# Patient Record
Sex: Male | Born: 1975 | Hispanic: No | Marital: Single | Smoking: Never smoker
Health system: Southern US, Community
[De-identification: ages and names within clinical notes are randomized; demographics above are authoritative.]

## PROBLEM LIST (undated history)

## (undated) HISTORY — PX: APPENDECTOMY: SHX54

---

## 1998-06-23 ENCOUNTER — Ambulatory Visit (HOSPITAL_BASED_OUTPATIENT_CLINIC_OR_DEPARTMENT_OTHER): Admission: RE | Admit: 1998-06-23 | Discharge: 1998-06-23 | Payer: Self-pay | Admitting: Orthopedic Surgery

## 2012-05-20 ENCOUNTER — Emergency Department: Payer: Self-pay | Admitting: Emergency Medicine

## 2017-01-26 ENCOUNTER — Encounter: Payer: Self-pay | Admitting: Emergency Medicine

## 2017-01-26 ENCOUNTER — Ambulatory Visit
Admission: EM | Admit: 2017-01-26 | Discharge: 2017-01-26 | Disposition: A | Discharge status: Home / Self Care | Payer: Worker's Compensation | Attending: Family Medicine | Admitting: Family Medicine

## 2017-01-26 ENCOUNTER — Ambulatory Visit (INDEPENDENT_AMBULATORY_CARE_PROVIDER_SITE_OTHER): Payer: Worker's Compensation

## 2017-01-26 DIAGNOSIS — M25562 Pain in left knee: Secondary | ICD-10-CM

## 2017-01-26 DIAGNOSIS — M25462 Effusion, left knee: Secondary | ICD-10-CM | POA: Diagnosis not present

## 2017-01-26 DIAGNOSIS — W1842XA Slipping, tripping and stumbling without falling due to stepping into hole or opening, initial encounter: Secondary | ICD-10-CM | POA: Diagnosis not present

## 2017-01-26 MED ORDER — NAPROXEN 500 MG PO TABS: 500.0000 mg | ORAL_TABLET | Freq: Two times a day (BID) | ORAL | 0 refills | Status: DC

## 2017-01-26 MED ORDER — KETOROLAC TROMETHAMINE 60 MG/2ML IM SOLN
60.0000 mg | Freq: Once | INTRAMUSCULAR | Status: AC
  Administered 2017-01-26: 60 mg via INTRAMUSCULAR

## 2017-01-26 NOTE — ED Provider Notes (Signed)
[facility]    CSN: [id number] Arrival date & time: 01/26/17  1752     History   Chief Complaint Chief Complaint  Patient presents with  . Knee Pain    left    HPI Trevor Mann is a 41 y.o. male.   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 At first it didn't seem to bother him but then as the day progressed he started having swelling and aching over the medial and lateral joint lines and when walking felt like he was something was rubbing in his patella. He said one point he almost had a vasovagal episode did not have a syncopal episode. He is able to walk on his leg but the pain is very severe.         History reviewed. No pertinent past medical history.  There are no active problems to display for this patient.   Past Surgical History:  Procedure Laterality Date  . APPENDECTOMY         Home Medications    Prior to Admission medications   Medication Sig Start Date End Date Taking? Authorizing Provider  naproxen (NAPROSYN) 500 MG tablet Take 1 tablet (500 mg total) by mouth 2 (two) times daily with a meal. 01/26/17   Lutricia Feiloemer, Yeison Sippel P, PA-C    Family History Family History  Problem Relation Age of Onset  . Lung cancer Mother     Social History Social History  Substance Use Topics  . Smoking status: Never Smoker  . Smokeless tobacco: Never Used  . Alcohol use Yes     Allergies   Patient has no known allergies.   Review of Systems Review of Systems  Constitutional: Positive for activity change. Negative for appetite change, chills, fatigue and fever.  Musculoskeletal: Positive for arthralgias, gait problem and joint swelling.  All other systems reviewed and are negative.    Physical Exam Triage Vital Signs ED Triage Vitals  Enc Vitals Group     BP 01/26/17 1811 134/79     Pulse Rate  01/26/17 1811 94     Resp 01/26/17 1811 16     Temp 01/26/17 1811 99 F (37.2 C)     Temp Source 01/26/17 1811 Oral     SpO2 01/26/17 1811 100 %     Weight 01/26/17 1808 (!) 335 lb (152 kg)     Height 01/26/17 1808 6\' 7"  (2.007 m)     Head Circumference --      Peak Flow --      Pain Score 01/26/17 1809 8     Pain Loc --      Pain Edu? --      Excl. in GC? --    No data found.   Updated Vital Signs BP 134/79 (BP Location: Right Arm)   Pulse 94   Temp 99 F (37.2 C) (Oral)   Resp 16   Ht 6\' 7"  (2.007 m)   Wt (!) 335 lb (152 kg)   SpO2 100%   BMI 37.74 kg/m   Visual Acuity Right Eye Distance:   Left Eye Distance:   Bilateral Distance:    Right Eye Near:   Left Eye Near:    Bilateral Near:     Physical Exam  Constitutional: He is oriented to person, place, and time. He appears well-developed and  well-nourished. No distress.  HENT:  Head: Normocephalic.  Eyes: Pupils are equal, round, and reactive to light.  Neck: Normal range of motion.  Musculoskeletal: He exhibits edema and tenderness.  Examination of the left knee shows swelling prepatellar. Elevation has good control of the quad is able to fully extend his knee with that discomfort. There is no effusion is elicited. There is a tenderness over the patella superficially. He has also pain over the medial joint line. Patient is very painful and adequate examination is not capable due to his pain and inability to cooperate fully.  Neurological: He is alert and oriented to person, place, and time.  Skin: Skin is warm and dry. He is not diaphoretic.  Psychiatric: He has a normal mood and affect. His behavior is normal. Judgment and thought content normal.  Nursing note and vitals reviewed.    UC Treatments / Results  Labs (all labs ordered are listed, but only abnormal results are displayed) Labs Reviewed - No data to display  EKG  EKG Interpretation None       Radiology Dg Knee Complete 4 Views  Left  Result Date: 01/26/2017 CLINICAL DATA:  Patient states that he stepped in a hole and pushed his left knee out today while on his job. Patient c/o pain in his left knee. Pt unable to stand for x-rays. EXAM: LEFT KNEE - COMPLETE 4+ VIEW COMPARISON:  None. FINDINGS: No fracture of the proximal tibia or distal femur. Patella is normal. No joint effusion. Swelling of the prepatellar soft tissue inferior to the patella IMPRESSION: Inferior prepatellar soft tissue swelling. No fracture dislocation. Electronically Signed   By: Genevive Bi M.D.   On: 01/26/2017 18:59    Procedures Procedures (including critical care time)  Medications Ordered in UC Medications  ketorolac (TORADOL) injection 60 mg (60 mg Intramuscular Given 01/26/17 1828)     Initial Impression / Assessment and Plan / UC Course  I have reviewed the triage vital signs and the nursing notes.  Pertinent labs & imaging results that were available during my care of the patient were reviewed by me and considered in my medical decision making (see chart for details).     Plan: 1. Test/x-ray results and diagnosis reviewed with patient 2. rx as per orders; risks, benefits, potential side effects reviewed with patient 3. Recommend supportive treatment with elevation and ice to control swelling and pain. Use knee immobilizer for ambulation. May come out of the knee immobilizer off going quiet interactive times and for sleep. Use crutches with touchdown to partial weightbearing gait for comfort. Follow-up with [facility] next week if not improving. 4. F/u prn if symptoms worsen or don't improve   Final Clinical Impressions(s) / UC Diagnoses   Final diagnoses:  Prepatellar effusion of left knee  Traumatic, possibly hemorrhagic  New Prescriptions New Prescriptions   NAPROXEN (NAPROSYN) 500 MG TABLET    Take 1 tablet (500 mg total) by mouth 2 (two) times daily with a meal.     Controlled Substance Prescriptions [place]  Controlled Substance Registry consulted? Not Applicable   Lutricia Feil, PA-C 01/26/17 1929

## 2017-01-26 NOTE — ED Triage Notes (Signed)
Patient states that he stepped in a hole and pushed his left knee out today while on his job.  Patient c/o pain in his left knee.

## 2017-01-26 NOTE — Discharge Instructions (Signed)
Elevate to your knee above your heart most of the time. Apply ice 20 minutes out of every 2 hours 45 times daily. May come out of the knee brace for sleeping or 4. Her periods of inactivity. Use crutches with a touchdown to partial weightbearing gait. If not improving by early next week scheduled appointment with orthopedic surgery as supplied to you.

## 2017-01-28 ENCOUNTER — Encounter: Payer: Self-pay | Admitting: Gynecology

## 2017-01-28 ENCOUNTER — Ambulatory Visit
Admission: EM | Admit: 2017-01-28 | Discharge: 2017-01-28 | Disposition: A | Discharge status: Home / Self Care | Payer: Worker's Compensation | Attending: Family Medicine | Admitting: Family Medicine

## 2017-01-28 DIAGNOSIS — L03116 Cellulitis of left lower limb: Secondary | ICD-10-CM

## 2017-01-28 DIAGNOSIS — M00862 Arthritis due to other bacteria, left knee: Secondary | ICD-10-CM

## 2017-01-28 DIAGNOSIS — M009 Pyogenic arthritis, unspecified: Secondary | ICD-10-CM

## 2017-01-28 NOTE — ED Provider Notes (Signed)
[facility]    CSN: [id number] Arrival date & time: 01/28/17  7564     History   Chief Complaint No chief complaint on file.   HPI Trevor Mann is a 41 y.o. male.   Patient is a 41 year old male who presents as a follow-up to a visit on the 25th. Patient was at work when he stepped in a hole, causing his mucobuccal. Patient did not have any pain initially but then afterwards the pain began to worsen. He was seen here with x-rays done with some peripatellar inflammation and pain to the medial and lateral aspects of the joint. Patient also had pain that made examination difficult per the note. Patient reports she's been taking his naproxen sodium as prescribed and has been wearing his immobilizer. Patient reports she also been doing ice. Patient states he woke up this morning within redness, burning with increased pain and swelling. The knee also feels hot to the touch.      History reviewed. No pertinent past medical history.  There are no active problems to display for this patient.   Past Surgical History:  Procedure Laterality Date  . APPENDECTOMY         Home Medications    Prior to Admission medications   Medication Sig Start Date End Date Taking? Authorizing Provider  naproxen (NAPROSYN) 500 MG tablet Take 1 tablet (500 mg total) by mouth 2 (two) times daily with a meal. 01/26/17  Yes Lutricia Feil, PA-C    Family History Family History  Problem Relation Age of Onset  . Lung cancer Mother     Social History Social History  Substance Use Topics  . Smoking status: Never Smoker  . Smokeless tobacco: Never Used  . Alcohol use Yes     Allergies   Patient has no known allergies.   Review of Systems Review of Systems  As noted above in history of present illness. Other systems reviewed and found to be negative.   Physical Exam Triage Vital Signs ED Triage Vitals  Enc Vitals Group     BP 01/28/17 1012 117/76     Pulse Rate  01/28/17 1012 78     Resp 01/28/17 1012 16     Temp 01/28/17 1012 99.1 F (37.3 C)     Temp Source 01/28/17 1007 Oral     SpO2 01/28/17 1012 96 %     Weight --      Height --      Head Circumference --      Peak Flow --      Pain Score 01/28/17 1011 6     Pain Loc --      Pain Edu? --      Excl. in GC? --    No data found.   Updated Vital Signs BP 117/76 (BP Location: Left Arm)   Pulse 78   Temp 99.1 F (37.3 C) (Oral)   Resp 16   SpO2 96%   Visual Acuity Right Eye Distance:   Left Eye Distance:   Bilateral Distance:    Right Eye Near:   Left Eye Near:    Bilateral Near:     Physical Exam  Constitutional: He is oriented to person, place, and time. He appears well-developed and well-nourished.  HENT:  Head: Normocephalic and atraumatic.  Eyes: Pupils are equal, round, and reactive to light. EOM are normal.  Musculoskeletal:       Left knee: He exhibits swelling and erythema.  Legs: Pain and redness to the left knee with the redness moving down to the level of the tibial plateau. Edema also noted and joint painful with even minimal touch. Joint also warm to the touch. Also tenderness to the areas redness below the knee concerning for cellulitis.  Neurological: He is alert and oriented to person, place, and time. No cranial nerve deficit.     UC Treatments / Results  Labs (all labs ordered are listed, but only abnormal results are displayed) Labs Reviewed - No data to display  EKG  EKG Interpretation None       Radiology Dg Knee Complete 4 Views Left  Result Date: 01/26/2017 CLINICAL DATA:  Patient states that he stepped in a hole and pushed his left knee out today while on his job. Patient c/o pain in his left knee. Pt unable to stand for x-rays. EXAM: LEFT KNEE - COMPLETE 4+ VIEW COMPARISON:  None. FINDINGS: No fracture of the proximal tibia or distal femur. Patella is normal. No joint effusion. Swelling of the prepatellar soft tissue inferior to  the patella IMPRESSION: Inferior prepatellar soft tissue swelling. No fracture dislocation. Electronically Signed   By: Genevive BiStewart  Edmunds M.D.   On: 01/26/2017 18:59    Procedures Procedures (including critical care time)  Medications Ordered in UC Medications - No data to display   Initial Impression / Assessment and Plan / UC Course  I have reviewed the triage vital signs and the nursing notes.  Pertinent labs & imaging results that were available during my care of the patient were reviewed by me and considered in my medical decision making (see chart for details).     Patient seen on the 25th after stepping in a hole and having his knee buckle. X-ray as above. This morning patient awoke with increasing pain swelling to the knee with new redness and warmth to the touch.  Final Clinical Impressions(s) / UC Diagnoses   Final diagnoses:  Cellulitis of leg, left  Pyogenic arthritis of left knee joint, due to unspecified organism [facility](HCC)   Exam and history is concerning for septic arthritis of the left knee joint with surrounding cellulitis. Has been recommended the patient and his wife that he be seen at an emergency room for further evaluation and beginning of management. Both the patient has wife are in agreement with that plan.   [facility]  New Prescriptions New Prescriptions   No medications on file     Controlled Substance Prescriptions [place] Controlled Substance Registry consulted? Not Applicable   Candis SchatzHarris, Bow Buntyn D, PA-C 01/28/17 1112

## 2017-01-28 NOTE — Discharge Instructions (Signed)
-  Knee exam and history is concerning for in infection of the joint, or septic joint. -This infection will be best further evaluated and treated at an emergency room, preferably one with orthopedics on staff. -Recommend that you go to an emergency room for further treatment.

## 2017-01-28 NOTE — ED Triage Notes (Signed)
Work injury  follow up. Left knee swelling and pain from 01/26/2017.

## 2017-05-11 ENCOUNTER — Other Ambulatory Visit: Payer: Self-pay

## 2017-05-11 ENCOUNTER — Ambulatory Visit
Admission: EM | Admit: 2017-05-11 | Discharge: 2017-05-11 | Disposition: A | Discharge status: Home / Self Care | Payer: Managed Care, Other (non HMO) | Attending: Family Medicine | Admitting: Family Medicine

## 2017-05-11 ENCOUNTER — Encounter: Payer: Self-pay | Admitting: *Deleted

## 2017-05-11 DIAGNOSIS — B9789 Other viral agents as the cause of diseases classified elsewhere: Secondary | ICD-10-CM

## 2017-05-11 DIAGNOSIS — R05 Cough: Secondary | ICD-10-CM

## 2017-05-11 DIAGNOSIS — J069 Acute upper respiratory infection, unspecified: Secondary | ICD-10-CM | POA: Diagnosis not present

## 2017-05-11 MED ORDER — BENZONATATE 100 MG PO CAPS: 100.0000 mg | ORAL_CAPSULE | Freq: Three times a day (TID) | ORAL | 0 refills | Status: DC | PRN

## 2017-05-11 MED ORDER — HYDROCOD POLST-CPM POLST ER 10-8 MG/5ML PO SUER: 5.0000 mL | Freq: Two times a day (BID) | ORAL | 0 refills | Status: DC | PRN

## 2017-05-11 NOTE — Discharge Instructions (Signed)
Tessalon during the day.  Tussionex in the evening.  Rest, fluids.  Take care  Dr. Adriana Simasook

## 2017-05-11 NOTE — ED Provider Notes (Signed)
[facility]    CSN: [id number] Arrival date & time: 05/11/17  [id number]  History   Chief Complaint Chief Complaint  Patient presents with  . Cough  . Nasal Congestion   HPI  42 year old male presents with the above complaints.  Patient states that he been sick since yesterday.  He states he has had severe cough and nasal congestion.  He is taken over-the-counter medication without relief.  No fevers or chills.  No body aches.  No known exacerbating relieving factors.  No other associated symptoms.  No other complaints at this time.  Social History Social History   Tobacco Use  . Smoking status: Never Smoker  . Smokeless tobacco: Never Used  Substance Use Topics  . Alcohol use: Yes  . Drug use: No   Allergies   Patient has no known allergies.   Review of Systems Review of Systems  Constitutional: Negative for fever.  HENT: Positive for congestion.   Respiratory: Positive for cough.    Physical Exam Triage Vital Signs ED Triage Vitals  Enc Vitals Group     BP 05/11/17 0857 129/74     Pulse Rate 05/11/17 0857 90     Resp 05/11/17 0857 18     Temp 05/11/17 0857 99.2 F (37.3 C)     Temp Source 05/11/17 0857 Oral     SpO2 05/11/17 0857 98 %     Weight 05/11/17 0858 (!) 345 lb (156.5 kg)     Height 05/11/17 0858 6\' 7"  (2.007 m)     Head Circumference --      Peak Flow --      Pain Score 05/11/17 0858 0     Pain Loc --      Pain Edu? --      Excl. in GC? --    Updated Vital Signs BP 129/74 (BP Location: Left Arm)   Pulse 90   Temp 99.2 F (37.3 C) (Oral)   Resp 18   Ht 6\' 7"  (2.007 m)   Wt (!) 345 lb (156.5 kg)   SpO2 98%   BMI 38.87 kg/m  Physical Exam  Constitutional: He is oriented to person, place, and time. He appears well-developed and well-nourished. No distress.  HENT:  Head: Normocephalic and atraumatic.  Mouth/Throat: Oropharynx is clear and moist.  Eyes: Conjunctivae are normal. Right eye exhibits no discharge. Left eye exhibits no  discharge.  Cardiovascular: Normal rate and regular rhythm.  No murmur heard. Pulmonary/Chest: Effort normal and breath sounds normal. He has no wheezes. He has no rales.  Neurological: He is alert and oriented to person, place, and time.  Psychiatric: He has a normal mood and affect. His behavior is normal.  Nursing note and vitals reviewed.  UC Treatments / Results  Labs (all labs ordered are listed, but only abnormal results are displayed) Labs Reviewed - No data to display  EKG  EKG Interpretation None       Radiology No results found.  Procedures Procedures (including critical care time)  Medications Ordered in UC Medications - No data to display   Initial Impression / Assessment and Plan / UC Course  I have reviewed the triage vital signs and the nursing notes.  Pertinent labs & imaging results that were available during my care of the patient were reviewed by me and considered in my medical decision making (see chart for details).     42 year old male presents with viral URI with cough.  Treating with Tessalon Perles and Tussionex.  Supportive care.  Final Clinical Impressions(s) / UC Diagnoses   Final diagnoses:  Viral URI with cough    ED Discharge Orders        Ordered    benzonatate (TESSALON) 100 MG capsule  3 times daily PRN     05/11/17 1009    chlorpheniramine-HYDROcodone (TUSSIONEX PENNKINETIC ER) 10-8 MG/5ML SUER  Every 12 hours PRN     05/11/17 1009     Controlled Substance Prescriptions [place] Controlled Substance Registry consulted? Not Applicable   Tommie Sams, DO 05/11/17 1105

## 2017-05-11 NOTE — ED Triage Notes (Signed)
Patient started having symptoms of severe cough and nasal congestion yesterday.

## 2018-11-17 ENCOUNTER — Encounter: Payer: Self-pay | Admitting: Emergency Medicine

## 2018-11-17 ENCOUNTER — Ambulatory Visit
Admission: EM | Admit: 2018-11-17 | Discharge: 2018-11-17 | Disposition: A | Discharge status: Home / Self Care | Payer: Managed Care, Other (non HMO) | Attending: Family Medicine | Admitting: Family Medicine

## 2018-11-17 ENCOUNTER — Other Ambulatory Visit: Payer: Self-pay

## 2018-11-17 DIAGNOSIS — J0100 Acute maxillary sinusitis, unspecified: Secondary | ICD-10-CM

## 2018-11-17 DIAGNOSIS — H6592 Unspecified nonsuppurative otitis media, left ear: Secondary | ICD-10-CM

## 2018-11-17 MED ORDER — AMOXICILLIN 875 MG PO TABS: 875.0000 mg | ORAL_TABLET | Freq: Two times a day (BID) | ORAL | 0 refills | Status: DC

## 2018-11-17 NOTE — ED Provider Notes (Signed)
[facility] ____________________________________________  Time seen: Approximately 12:34 PM  I have reviewed the triage vital signs and the nursing notes.   HISTORY  Chief Complaint Otalgia (left) and Sinus Problem   HPI Trevor Mann is a 43 y.o. male presenting for evaluation of 3 days of nasal congestion and sinus pressure that has been gradually increasing.  Reports also 3 days of left ear discomfort that also has been increasing in discomfort.  Also some right ear discomfort.  States postnasal drainage.  Reports this started just after working outside and mowing his yard.  Does have a history of seasonal allergies and recurrent sinusitis.  Has not been taking any over-the-counter medications regularly.  Denies accompanying fever, sore throat, cough, chest pain, shortness of breath, changes in taste or smell, vomiting or diarrhea.  Reports otherwise doing well.  Denies known sick contacts.  No other recent sickness.   History reviewed. No pertinent past medical history.  There are no active problems to display for this patient.   Past Surgical History:  Procedure Laterality Date  . APPENDECTOMY       No current facility-administered medications for this encounter.   Current Outpatient Medications:  .  amoxicillin (AMOXIL) 875 MG tablet, Take 1 tablet (875 mg total) by mouth 2 (two) times daily., Disp: 20 tablet, Rfl: 0  Allergies Patient has no known allergies.  Family History  Problem Relation Age of Onset  . Lung cancer Mother     Social History Social History   Tobacco Use  . Smoking status: Never Smoker  . Smokeless tobacco: Never Used  Substance Use Topics  . Alcohol use: Yes  . Drug use: No    Review of Systems Constitutional: No fever Eyes: No visual changes. ENT: As above.  Cardiovascular: Denies chest pain. Respiratory: Denies shortness of breath. Gastrointestinal: No abdominal pain.   Musculoskeletal: Negative for back pain.  Skin: Negative for rash.   ____________________________________________   PHYSICAL EXAM:  VITAL SIGNS: ED Triage Vitals  Enc Vitals Group     BP 11/17/18 1125 122/87     Pulse Rate 11/17/18 1125 60     Resp 11/17/18 1125 16     Temp 11/17/18 1125 98.1 F (36.7 C)     Temp Source 11/17/18 1125 Oral     SpO2 11/17/18 1125 98 %     Weight 11/17/18 1123 (!) 340 lb (154.2 kg)     Height 11/17/18 1123 6\' 7"  (2.007 m)     Head Circumference --      Peak Flow --      Pain Score 11/17/18 1122 7     Pain Loc --      Pain Edu? --      Excl. in [place]? --    Constitutional: Alert and oriented. Well appearing and in no acute distress. Eyes: Conjunctivae are normal.  Head: Atraumatic.Mild tenderness to palpation bilateral maxillary sinuses.  No tenderness to frontal sinuses.  No swelling. No erythema.   Ears:   Left: Nontender, normal canal, effusion present with moderate erythema.  Right: Nontender, normal canal, mild effusion present without erythema, otherwise normal TM.  No mastoid tenderness bilaterally.  Nose: nasal congestion with bilateral nasal turbinate erythema and edema.   Mouth/Throat: Mucous membranes are moist.  Oropharynx non-erythematous. No tonsillar swelling or exudate.  Neck: No stridor.  No cervical spine tenderness to palpation. Hematological/Lymphatic/Immunilogical: No cervical lymphadenopathy. Cardiovascular: Normal rate, regular rhythm. Grossly normal heart sounds.  Good peripheral circulation. Respiratory: Normal respiratory  effort.  No retractions. No wheezes, rales or rhonchi. Good air movement.  Musculoskeletal: Steady gait. Neurologic:  Normal speech and language. No gait instability. Skin:  Skin is warm, dry and intact. No rash noted. Psychiatric: Mood and affect are normal. Speech and behavior are normal. ___________________________________________   LABS (all labs ordered are listed, but only abnormal results are displayed)  Labs Reviewed - No data to  display   PROCEDURES Procedures   INITIAL IMPRESSION / ASSESSMENT AND PLAN / ED COURSE  Pertinent labs & imaging results that were available during my care of the patient were reviewed by me and considered in my medical decision making (see chart for details).  Well-appearing patient.  No acute distress.  Suspect recent allergic rhinitis.  Left otitis with effusion.  Will treat with oral amoxicillin.  Recommend over-the-counter antihistamine and Sudafed.  Supportive care.Discussed indication, risks and benefits of medications with patient.  Discussed follow up with Primary care physician this week. Discussed follow up and return parameters including no resolution or any worsening concerns. Patient verbalized understanding and agreed to plan.   ____________________________________________   FINAL CLINICAL IMPRESSION(S) / ED DIAGNOSES  Final diagnoses:  Left otitis media with effusion  Acute maxillary sinusitis, recurrence not specified     ED Discharge Orders         Ordered    amoxicillin (AMOXIL) 875 MG tablet  2 times daily     11/17/18 1150           Note: This dictation was prepared with Dragon dictation along with smaller phrase technology. Any transcriptional errors that result from this process are unintentional.         Renford DillsMiller, Selvin Yun, NP 11/17/18 1337

## 2018-11-17 NOTE — ED Triage Notes (Signed)
Patient c/o sinus congestion and pressure and pressure in his left ear that started yesterday.  Patient denies fevers.

## 2018-11-17 NOTE — Discharge Instructions (Addendum)
Take medication as prescribed. Rest. Drink plenty of fluids. Over the counter antihistamine and sudafed.  Follow up with your primary care physician this week as needed. Return to Urgent care for new or worsening concerns.

## 2019-01-28 ENCOUNTER — Ambulatory Visit
Admission: EM | Admit: 2019-01-28 | Discharge: 2019-01-28 | Disposition: A | Discharge status: Home / Self Care | Payer: Managed Care, Other (non HMO) | Attending: Emergency Medicine | Admitting: Emergency Medicine

## 2019-01-28 ENCOUNTER — Other Ambulatory Visit: Payer: Self-pay

## 2019-01-28 DIAGNOSIS — M5442 Lumbago with sciatica, left side: Secondary | ICD-10-CM

## 2019-01-28 MED ORDER — CYCLOBENZAPRINE HCL 10 MG PO TABS: 10.0000 mg | ORAL_TABLET | Freq: Two times a day (BID) | ORAL | 0 refills | Status: DC | PRN

## 2019-01-28 MED ORDER — PREDNISONE 10 MG PO TABS: ORAL_TABLET | ORAL | 0 refills | Status: DC

## 2019-01-28 NOTE — ED Provider Notes (Signed)
[facility] ____________________________________________  Time seen: Approximately 9:01 AM  I have reviewed the triage vital signs and the nursing notes.   HISTORY  Chief Complaint Back Pain   HPI Trevor Mann is a 43 y.o. male presenting for evaluation of lower back pain.  Patient reports he has recently started exercising again, running on the treadmill.  Reports Saturday morning he woke up with some lower back pain that progressed through the weekend.  States pain is now left lower and intermittently does radiate down left buttocks.  Denies any fall, direct injury or direct trauma.  Denies abrupt onset.  States he does have a history of some similar back pain flareups.  Denies dysuria, abdominal pain, fevers, urinary or bowel retention or incontinence, rash.  Has been taking over-the-counter ibuprofen without resolution.  States he has no pain if sitting completely still at rest, and reports pain is mostly with position changes.  Denies recent sickness.  Reports otherwise doing well.   History reviewed. No pertinent past medical history.  There are no active problems to display for this patient.   Past Surgical History:  Procedure Laterality Date  . APPENDECTOMY       No current facility-administered medications for this encounter.   Current Outpatient Medications:  .  cyclobenzaprine (FLEXERIL) 10 MG tablet, Take 1 tablet (10 mg total) by mouth 2 (two) times daily as needed for muscle spasms. Do not drive while taking as can cause drowsiness, Disp: 20 tablet, Rfl: 0 .  predniSONE (DELTASONE) 10 MG tablet, Start 60 mg po day one, then 50 mg po day two, taper by 10 mg daily until complete., Disp: 21 tablet, Rfl: 0  Allergies Patient has no known allergies.  Family History  Problem Relation Age of Onset  . Lung cancer Mother     Social History Social History   Tobacco Use  . Smoking status: Never Smoker  . Smokeless tobacco: Never Used  Substance Use  Topics  . Alcohol use: Yes  . Drug use: No    Review of Systems Constitutional: No fever ENT: No sore throat. Cardiovascular: Denies chest pain. Respiratory: Denies shortness of breath. Gastrointestinal: No abdominal pain.  No nausea, no vomiting.  No diarrhea.  No constipation. Genitourinary: Negative for dysuria. Musculoskeletal: Positive for back pain. Skin: Negative for rash. Neurological: Negative for focal weakness or numbness.   ____________________________________________   PHYSICAL EXAM:  VITAL SIGNS: ED Triage Vitals  Enc Vitals Group     BP 01/28/19 0809 (!) 143/98     Pulse Rate 01/28/19 0809 61     Resp 01/28/19 0809 18     Temp 01/28/19 0809 97.9 F (36.6 C)     Temp Source 01/28/19 0809 Oral     SpO2 01/28/19 0809 100 %     Weight 01/28/19 0810 (!) 355 lb (161 kg)     Height 01/28/19 0810 6\' 7"  (2.007 m)     Head Circumference --      Peak Flow --      Pain Score 01/28/19 0810 8     Pain Loc --      Pain Edu? --      Excl. in [place]? --     Constitutional: Alert and oriented. Well appearing and in no acute distress. Eyes: Conjunctivae are normal.  ENT      Head: Normocephalic and atraumatic. Hematological/Lymphatic/Immunilogical: No cervical lymphadenopathy. Cardiovascular: Normal rate, regular rhythm. Grossly normal heart sounds.  Good peripheral circulation. Respiratory: Normal respiratory effort  without tachypnea nor retractions. Breath sounds are clear and equal bilaterally. No wheezes, rales, rhonchi. Gastrointestinal: Soft and nontender. No CVA tenderness. Musculoskeletal: No midline cervical, thoracic or lumbar tenderness to palpation. Bilateral pedal pulses equal and easily palpated. Except: Left lower paralumbar tenderness to palpation and tenderness in left lower sciatic notch, no point bony tenderness, able to independently weight-bear on each leg, minimal pain with left standing knee left, no pain with right knee left, no saddle anesthesia,  changes positions quickly, mild pain with right and left lumbar rotation, increased pain with lumbar flexion and extension, steady gait. Neurologic:  Normal speech and language. No gross focal neurologic deficits are appreciated. Speech is normal. No gait instability.  Skin:  Skin is warm, dry and intact. No rash noted. Psychiatric: Mood and affect are normal. Speech and behavior are normal. Patient exhibits appropriate insight and judgment   ___________________________________________   LABS (all labs ordered are listed, but only abnormal results are displayed)  Labs Reviewed - No data to display ____________________________________________   PROCEDURES Procedures    INITIAL IMPRESSION / ASSESSMENT AND PLAN / ED COURSE  Pertinent labs & imaging results that were available during my care of the patient were reviewed by me and considered in my medical decision making (see chart for details).  Well-appearing patient.  No acute distress.  Suspect musculoskeletal pain.  Pain reproducible with movement during exam.  Will treat with prednisone and as needed Flexeril.  Encourage rest, fluids, supportive care, stretching and monitoring.  Work note given.Discussed indication, risks and benefits of medications with patient.  Discussed follow up with Primary care physician this week. Discussed follow up and return parameters including no resolution or any worsening concerns. Patient verbalized understanding and agreed to plan.   ____________________________________________   FINAL CLINICAL IMPRESSION(S) / ED DIAGNOSES  Final diagnoses:  Acute left-sided low back pain with left-sided sciatica     ED Discharge Orders         Ordered    predniSONE (DELTASONE) 10 MG tablet     01/28/19 0832    cyclobenzaprine (FLEXERIL) 10 MG tablet  2 times daily PRN     01/28/19 9563           Note: This dictation was prepared with Dragon dictation along with smaller phrase technology. Any  transcriptional errors that result from this process are unintentional.         Renford Dills, NP 01/28/19 [phone]

## 2019-01-28 NOTE — ED Triage Notes (Addendum)
Reports lower back pain since Friday, worse with movement and decreased with rest. Pt reports that over the weekend, back pain continued to increase and now is radiating to left hip. Denies urinary sx. Pt alert and oriented X4, cooperative, RR even and unlabored, color WNL. Pt in NAD.

## 2019-01-28 NOTE — Discharge Instructions (Signed)
Take medication as prescribed. Rest. Drink plenty of fluids. Heat/Ice. Stretch.   Follow up with your primary care physician this week as needed. Return to Urgent care for new or worsening concerns.

## 2021-02-21 ENCOUNTER — Other Ambulatory Visit: Payer: Self-pay

## 2021-02-21 ENCOUNTER — Ambulatory Visit
Admission: EM | Admit: 2021-02-21 | Discharge: 2021-02-21 | Disposition: A | Discharge status: Home / Self Care | Payer: 59 | Attending: Emergency Medicine | Admitting: Emergency Medicine

## 2021-02-21 DIAGNOSIS — J0140 Acute pansinusitis, unspecified: Secondary | ICD-10-CM | POA: Diagnosis not present

## 2021-02-21 DIAGNOSIS — Z20822 Contact with and (suspected) exposure to covid-19: Secondary | ICD-10-CM

## 2021-02-21 MED ORDER — FLUTICASONE PROPIONATE 50 MCG/ACT NA SUSP: 2.0000 | Freq: Every day | NASAL | 0 refills | Status: AC

## 2021-02-21 MED ORDER — AMOXICILLIN-POT CLAVULANATE 875-125 MG PO TABS: 1.0000 | ORAL_TABLET | Freq: Two times a day (BID) | ORAL | 0 refills | Status: DC

## 2021-02-21 NOTE — ED Provider Notes (Signed)
HPI  SUBJECTIVE:  Trevor Mann is a 45 y.o. male who presents with 3 days of sinus headache, sinus pain and pressure, nasal congestion, green/brown rhinorrhea, postnasal drip, sore throat secondary to postnasal drip and a cough productive of the same material at night.  No fevers, facial swelling, upper dental pain, wheezing, chest pain, shortness of breath, loss of sense of smell or taste, nausea, vomiting, diarrhea, abdominal pain.  No known COVID or flu exposure.  He has had the third dose of the COVID-vaccine and got this years flu vaccine.  No antipyretic in the past 6 hours, no antibiotics in the past month.  He has tried Alka-Seltzer cold and sinus and ibuprofen 600 mg.  Alka-Seltzer helps.  No aggravating factors.  He also reports bilateral eye redness and crusting this morning.  He wears contacts, but denies sleeping in them.  He states that his eyes feel irritated.  He denies eye pain, photophobia, itchy eyes, foreign body sensation, ear pain, pain with extraocular movements, visual changes.  No trauma to the eye, no contacts with conjunctivitis.  He tried rewetting drops with improvement in his symptoms.  States that he gets these eye symptoms when he gets sinus infections.  He states these symptoms are identical to previous sinus infections which he states he gets around this time every year.  He has no past medical history.  PMD: None.  History reviewed. No pertinent past medical history.  Past Surgical History:  Procedure Laterality Date   APPENDECTOMY      Family History  Problem Relation Age of Onset   Lung cancer Mother     Social History   Tobacco Use   Smoking status: Never   Smokeless tobacco: Never  Vaping Use   Vaping Use: Never used  Substance Use Topics   Alcohol use: Yes   Drug use: No    No current facility-administered medications for this encounter.  Current Outpatient Medications:    amoxicillin-clavulanate (AUGMENTIN) 875-125 MG tablet, Take 1 tablet  by mouth 2 (two) times daily. X 7 days, Disp: 14 tablet, Rfl: 0   fluticasone (FLONASE) 50 MCG/ACT nasal spray, Place 2 sprays into both nostrils daily., Disp: 16 g, Rfl: 0  No Known Allergies   ROS  As noted in HPI.   Physical Exam  BP 140/79 (BP Location: Left Arm)   Pulse 74   Temp 98.3 F (36.8 C) (Oral)   Resp 18   Ht 6\' 7"  (2.007 m)   Wt (!) 149.7 kg   SpO2 100%   BMI 37.18 kg/m   Constitutional: Well developed, well nourished, no acute distress Eyes:  EOMI, no pain with EOMs PERRLA, no direct or consensual photophobia.  Positive crusting.  No pain with EOMs.  No foreign body seen on lid eversion.  No erythema, edema. HENT: Normocephalic, atraumatic,mucus membranes moist.  Purulent nasal congestion.  Normal turbinates.  No maxillary, frontal sinus tenderness.  Positive postnasal drip. Respiratory: Normal inspiratory effort, lungs clear bilaterally Cardiovascular: Normal rate, regular rhythm, no murmurs rubs or gallop GI: nondistended skin: No rash, skin intact Musculoskeletal: no deformities Neurologic: Alert & oriented x 3, no focal neuro deficits Psychiatric: Speech and behavior appropriate   ED Course   Medications - No data to display  Orders Placed This Encounter  Procedures   SARS CORONAVIRUS 2 (TAT 6-24 HRS) Nasopharyngeal Nasopharyngeal Swab    Standing Status:   Standing    Number of Occurrences:   1    Results for orders  placed or performed during the hospital encounter of 02/21/21 (from the past 24 hour(s))  SARS CORONAVIRUS 2 (TAT 6-24 HRS) Nasopharyngeal Nasopharyngeal Swab     Status: None   Collection Time: 02/21/21  2:16 PM   Specimen: Nasopharyngeal Swab  Result Value Ref Range   SARS Coronavirus 2 NEGATIVE NEGATIVE   No results found.  ED Clinical Impression  1. Acute non-recurrent pansinusitis   2. Encounter for laboratory testing for COVID-19 virus      ED Assessment/Plan  Will check COVID.  Qualifies for Molnupiravir based on  BMI above 30.Marland Kitchen  Patient with a sinusitis.,  Saline nasal irrigation, Mucinex D, Flonase and Tylenol/ibuprofen as needed.  Wait-and-see prescription of Augmentin.  If COVID is negative and he is not getting better with supportive treatment, he will go ahead and start it.  Follow-up with primary care provider of choice.  Will provide primary care list and order assistance in finding a PMD.   COVID-negative.  Patient to try supportive treatment first, if this does not work, then start the Augmentin.  Discussed labs, MDM, treatment plan, and plan for follow-up with patient. patient agrees with plan.   Meds ordered this encounter  Medications   fluticasone (FLONASE) 50 MCG/ACT nasal spray    Sig: Place 2 sprays into both nostrils daily.    Dispense:  16 g    Refill:  0   amoxicillin-clavulanate (AUGMENTIN) 875-125 MG tablet    Sig: Take 1 tablet by mouth 2 (two) times daily. X 7 days    Dispense:  14 tablet    Refill:  0      *This clinic note was created using Scientist, clinical (histocompatibility and immunogenetics). Therefore, there may be occasional mistakes despite careful proofreading.  ?    Domenick Gong, MD 02/22/21 0700

## 2021-02-21 NOTE — Discharge Instructions (Addendum)
Saline nasal irrigation with a Lloyd Huger Med rinse and distilled water as often as you want, Mucinex D, Flonase and Tylenol/ibuprofen together 3-4 times a day as needed.  Wait-and-see prescription of Augmentin.  If COVID is negative and you are not getting better with supportive treatment in a day or 2, go ahead and start it.  If your COVID is positive, I will prescribe Molnupiravir.  Here is a list of primary care providers who are taking new patients:  Dr. Elizabeth Sauer [address] [place] [address] [phone]  [facility] Primary Care at [facility] [address] [place], [place] [address] [phone]  [facility] Primary Care [place] [address] [place] [address]  [phone]  [facility] [address] [place], [place] [address] [phone]  [facility] [address] [place]  [phone] [place], [place] [address]  Here are clinics/ other resources who will see you if you do not have insurance. Some have certain criteria that you must meet. Call them and find out what they are:  [facility]: [address] [place], [place] [address] Phone: [phone] Hours: First and Third Saturdays of each Month, 9 a.m. - 1 p.m.  [facility]: [address], [address] [place], [place] [address] Phone: [phone] Hours: Tuesday, 4 p.m. - 8 p.m. Thursday, 1 p.m. - 8 p.m. Wednesday, 9 a.m. - [facility] [address] [place], [place] [address] Phone: [phone] Pharmacy Phone Number: [phone] Dental Phone Number: [phone] [facility] Insurance Help: [phone]  Dental Hours: Monday - Thursday, 8 a.m. - 6 p.m.  [facility] [address], [place], [place] [address] Phone: [phone] Pharmacy Phone Number: [phone] [facility] Insurance Help: [phone]  [facility] [address] [place] [place], [place] [address] Phone: [phone] Pharmacy Phone Number: [phone] [facility]  Insurance Help: [phone]  [facility] [address] [place], [place] [address] Phone: [phone] [facility] Insurance Help: [phone]   [facility] [address], [place], [place] [address] Phone: [phone]  Go to www.goodrx.com  or www.costplusdrugs.com to look up your medications. This will give you a list of where you can find your prescriptions at the most affordable prices. Or ask the pharmacist what the cash price is, or if they have any other discount programs available to help make your medication more affordable. This can be less expensive than what you would pay with insurance.

## 2021-02-21 NOTE — ED Triage Notes (Signed)
Pt here with C/O nasal drainage, eye irritation at night with the drainage for 3 days. States always gets sinus infections around this time of the year

## 2021-02-22 LAB — SARS CORONAVIRUS 2 (TAT 6-24 HRS): SARS Coronavirus 2: NEGATIVE

## 2021-03-04 DIAGNOSIS — U071 COVID-19: Secondary | ICD-10-CM

## 2021-03-04 HISTORY — DX: COVID-19: U07.1

## 2021-03-18 ENCOUNTER — Ambulatory Visit (INDEPENDENT_AMBULATORY_CARE_PROVIDER_SITE_OTHER): Payer: 59

## 2021-03-18 ENCOUNTER — Ambulatory Visit
Admission: EM | Admit: 2021-03-18 | Discharge: 2021-03-18 | Disposition: A | Discharge status: Home / Self Care | Payer: 59 | Attending: Physician Assistant | Admitting: Physician Assistant

## 2021-03-18 ENCOUNTER — Encounter: Payer: Self-pay | Admitting: Emergency Medicine

## 2021-03-18 ENCOUNTER — Other Ambulatory Visit: Payer: Self-pay

## 2021-03-18 DIAGNOSIS — R051 Acute cough: Secondary | ICD-10-CM

## 2021-03-18 DIAGNOSIS — U071 COVID-19: Secondary | ICD-10-CM | POA: Diagnosis not present

## 2021-03-18 DIAGNOSIS — J9801 Acute bronchospasm: Secondary | ICD-10-CM

## 2021-03-18 MED ORDER — HYDROCOD POLST-CPM POLST ER 10-8 MG/5ML PO SUER: 5.0000 mL | Freq: Two times a day (BID) | ORAL | 0 refills | Status: DC | PRN

## 2021-03-18 MED ORDER — PREDNISONE 20 MG PO TABS: 40.0000 mg | ORAL_TABLET | Freq: Every day | ORAL | 0 refills | Status: AC

## 2021-03-18 MED ORDER — ALBUTEROL SULFATE HFA 108 (90 BASE) MCG/ACT IN AERS: 1.0000 | INHALATION_SPRAY | Freq: Four times a day (QID) | RESPIRATORY_TRACT | 0 refills | Status: DC | PRN

## 2021-03-18 NOTE — Discharge Instructions (Signed)
-  Your x-ray does not show any evidence of pneumonia but some of the lower aspects of your lungs are not filled with air.  I think this is because you have a hard time taking a breath without coughing.  I have sent prednisone and an inhaler to try to help with that as well as a cough medication. - Increase rest and fluids. - You should stay home for the next few days. - Can go back to work when you are feeling better and fever free. - You should return or go to ER if you have fever, increased chest pain or breathing difficulty or feeling weak or worse.

## 2021-03-18 NOTE — ED Triage Notes (Addendum)
Pt presents today with c/o of cough x 6 days. Tested positive for Covid. He does report fever on Tuesday. Pain 7/10 rib pain with cough. Took Dayquil this morning around 7a. Pt declined Tylenol for pain in triage.

## 2021-03-18 NOTE — ED Provider Notes (Signed)
[facility]    CSN: [id number] Arrival date & time: 03/18/21  1241      History   Chief Complaint Chief Complaint  Patient presents with   Cough   Covid Positive    HPI Trevor Mann is a 44 y.o. male presenting for cough, congestion. Fever 2 days ago. Tested positive for COVID at home 5 days ago. Has been taking Dayquil/NyQuil for symptoms.  Patient says cough medications are stronger.  Cough is getting worse.  Denies it being productive.  Says he has a lot of pain in his throat and his chest and upper abdomen when he coughs.  Says any take a deep breath he goes into coughing fits.  Denies feeling short of breath.  Says it feels like he is wheezing sometimes though.  Patient states he has been vaccinated for COVID-19 x2 and boosted x1.  Patient not taking any antiviral medications and has not been seen for COVID until today.  Does not report any chronic medical issues.  HPI  Past Medical History:  Diagnosis Date   COVID-19 03/2021    Patient Active Problem List   Diagnosis Date Noted   COVID-19 03/18/2021    Past Surgical History:  Procedure Laterality Date   APPENDECTOMY         Home Medications    Prior to Admission medications   Medication Sig Start Date End Date Taking? Authorizing Provider  albuterol (VENTOLIN HFA) 108 (90 Base) MCG/ACT inhaler Inhale 1-2 puffs into the lungs every 6 (six) hours as needed for up to 15 days for wheezing or shortness of breath. 03/18/21 04/02/21 Yes Shirlee Latch, PA-C  chlorpheniramine-HYDROcodone (TUSSIONEX PENNKINETIC ER) 10-8 MG/5ML SUER Take 5 mLs by mouth every 12 (twelve) hours as needed for cough. 03/18/21  Yes Shirlee Latch, PA-C  predniSONE (DELTASONE) 20 MG tablet Take 2 tablets (40 mg total) by mouth daily for 5 days. 03/18/21 03/23/21 Yes Eusebio Friendly B, PA-C  fluticasone (FLONASE) 50 MCG/ACT nasal spray Place 2 sprays into both nostrils daily. 02/21/21   Domenick Gong, MD    Family  History Family History  Problem Relation Age of Onset   Lung cancer Mother     Social History Social History   Tobacco Use   Smoking status: Never   Smokeless tobacco: Never  Vaping Use   Vaping Use: Never used  Substance Use Topics   Alcohol use: Yes   Drug use: No     Allergies   Patient has no known allergies.   Review of Systems Review of Systems  Constitutional:  Positive for fatigue. Negative for fever.  HENT:  Positive for congestion, rhinorrhea and sore throat. Negative for sinus pressure and sinus pain.   Respiratory:  Positive for cough and wheezing. Negative for shortness of breath.   Cardiovascular:  Negative for chest pain.  Gastrointestinal:  Negative for abdominal pain, diarrhea, nausea and vomiting.  Musculoskeletal:  Positive for myalgias.  Neurological:  Positive for headaches. Negative for weakness and light-headedness.  Hematological:  Negative for adenopathy.    Physical Exam Triage Vital Signs ED Triage Vitals  Enc Vitals Group     BP 03/18/21 1304 123/82     Pulse Rate 03/18/21 1302 90     Resp 03/18/21 1302 20     Temp 03/18/21 1302 99.8 F (37.7 C)     Temp Source 03/18/21 1302 Oral     SpO2 03/18/21 1302 96 %     Weight --  Height --      Head Circumference --      Peak Flow --      Pain Score 03/18/21 1259 7     Pain Loc --      Pain Edu? --      Excl. in [place]? --    No data found.  Updated Vital Signs BP 123/82 (BP Location: Right Arm)    Pulse 90    Temp 99.8 F (37.7 C) (Oral)    Resp 20    SpO2 96%       Physical Exam Vitals and nursing note reviewed.  Constitutional:      General: He is not in acute distress.    Appearance: Normal appearance. He is well-developed. He is obese. He is ill-appearing.  HENT:     Head: Normocephalic and atraumatic.     Nose: Congestion and rhinorrhea present.     Mouth/Throat:     Mouth: Mucous membranes are moist.     Pharynx: Oropharynx is clear. Posterior oropharyngeal erythema  present.  Eyes:     General: No scleral icterus.    Conjunctiva/sclera: Conjunctivae normal.  Cardiovascular:     Rate and Rhythm: Normal rate and regular rhythm.     Heart sounds: Normal heart sounds.  Pulmonary:     Effort: Pulmonary effort is normal. No respiratory distress.     Breath sounds: Rhonchi (rhonchi bilateral upper lung fields) present.     Comments: Patient repeatedly coughing throughout exam visit.  Cough sounds bronchial.  Having difficulty taking deep breath without coughing. Musculoskeletal:     Cervical back: Neck supple.  Skin:    General: Skin is warm and dry.     Capillary Refill: Capillary refill takes less than 2 seconds.  Neurological:     General: No focal deficit present.     Mental Status: He is alert. Mental status is at baseline.     Motor: No weakness.     Coordination: Coordination normal.     Gait: Gait normal.  Psychiatric:        Mood and Affect: Mood normal.        Behavior: Behavior normal.        Thought Content: Thought content normal.     UC Treatments / Results  Labs (all labs ordered are listed, but only abnormal results are displayed) Labs Reviewed - No data to display  EKG   Radiology DG Chest 2 View  Result Date: 03/18/2021 CLINICAL DATA:  Cough x6 days.  COVID positive. EXAM: CHEST - 2 VIEW COMPARISON:  None. FINDINGS: The heart size and mediastinal contours are within normal limits. Low lung volumes with bibasilar atelectasis. No focal airspace consolidation. No visible pleural effusion or pneumothorax. The visualized skeletal structures are unremarkable. IMPRESSION: Low lung volumes with bibasilar atelectasis. No focal airspace consolidation. Electronically Signed   By: Maudry Mayhew M.D.   On: 03/18/2021 13:41    Procedures Procedures (including critical care time)  Medications Ordered in UC Medications - No data to display  Initial Impression / Assessment and Plan / UC Course  I have reviewed the triage vital signs  and the nursing notes.  Pertinent labs & imaging results that were available during my care of the patient were reviewed by me and considered in my medical decision making (see chart for details).  45 year old male presenting for cough and congestion as well as difficulty taking a deep breath due to cough and coughing fits for the past 6 days.  Positive COVID home test 5 days ago.  Taking over-the-counter cough medications without improvement in symptoms and having difficulty sleeping at night.  Vitals are all stable.  He is ill-appearing but nontoxic.  Nasal congestion and posterior pharyngeal erythema on exam.  Patient coughs numerous times throughout visit.  Having obvious difficulty taking a deep breath due to cough and bronchospasm.  Rhonchi bilateral upper airways.  No respiratory distress.  No wheezing or rales.  X-ray performed and reviewed by me.  X-ray does not reveal any evidence of consolidation or infiltrate.  He does have atelectasis bilateral bases.  Discussed results with patient.  Suspect atelectasis because he is not taking a deep breath due to the cough.  We will treat at this time with prednisone, albuterol and Tussionex.  Reviewed controlled substance database and find patient to be of low risk for abuse.  Out of the window for treatment with antiviral medication given his symptom duration.  Advised him to rest increase fluids.  Reviewed return and ER precautions.  Again reviewed current CDC guidelines, isolation protocol and ED precautions and is to COVID-19.  Work note given.   Final Clinical Impressions(s) / UC Diagnoses   Final diagnoses:  COVID-19  Acute cough  Bronchospasm     Discharge Instructions      -Your x-ray does not show any evidence of pneumonia but some of the lower aspects of your lungs are not filled with air.  I think this is because you have a hard time taking a breath without coughing.  I have sent prednisone and an inhaler to try to help with that as  well as a cough medication. - Increase rest and fluids. - You should stay home for the next few days. - Can go back to work when you are feeling better and fever free. - You should return or go to ER if you have fever, increased chest pain or breathing difficulty or feeling weak or worse.     ED Prescriptions     Medication Sig Dispense Auth. Provider   predniSONE (DELTASONE) 20 MG tablet Take 2 tablets (40 mg total) by mouth daily for 5 days. 10 tablet Shirlee Latch, PA-C   chlorpheniramine-HYDROcodone (TUSSIONEX PENNKINETIC ER) 10-8 MG/5ML SUER Take 5 mLs by mouth every 12 (twelve) hours as needed for cough. 70 mL Eusebio Friendly B, PA-C   albuterol (VENTOLIN HFA) 108 (90 Base) MCG/ACT inhaler Inhale 1-2 puffs into the lungs every 6 (six) hours as needed for up to 15 days for wheezing or shortness of breath. 1 g Shirlee Latch, PA-C      I have reviewed the PDMP during this encounter.   Shirlee Latch, PA-C 03/18/21 1355

## 2022-05-18 ENCOUNTER — Telehealth: Payer: Self-pay

## 2022-05-18 ENCOUNTER — Ambulatory Visit
Admission: EM | Admit: 2022-05-18 | Discharge: 2022-05-18 | Disposition: A | Discharge status: Home / Self Care | Payer: 59 | Attending: Family Medicine | Admitting: Family Medicine

## 2022-05-18 DIAGNOSIS — J209 Acute bronchitis, unspecified: Secondary | ICD-10-CM | POA: Diagnosis not present

## 2022-05-18 MED ORDER — ALBUTEROL SULFATE HFA 108 (90 BASE) MCG/ACT IN AERS: 2.0000 | INHALATION_SPRAY | RESPIRATORY_TRACT | 0 refills | Status: AC | PRN

## 2022-05-18 MED ORDER — IPRATROPIUM-ALBUTEROL 0.5-2.5 (3) MG/3ML IN SOLN
3.0000 mL | Freq: Once | RESPIRATORY_TRACT | Status: AC
  Administered 2022-05-18: 3 mL via RESPIRATORY_TRACT

## 2022-05-18 MED ORDER — CHERATUSSIN AC 100-10 MG/5ML PO SOLN: 5.0000 mL | Freq: Three times a day (TID) | ORAL | 0 refills | Status: DC | PRN

## 2022-05-18 MED ORDER — PROMETHAZINE-DM 6.25-15 MG/5ML PO SYRP: 5.0000 mL | ORAL_SOLUTION | Freq: Four times a day (QID) | ORAL | 0 refills | Status: AC | PRN

## 2022-05-18 MED ORDER — PREDNISONE 50 MG PO TABS: 50.0000 mg | ORAL_TABLET | Freq: Every day | ORAL | 0 refills | Status: AC

## 2022-05-18 MED ORDER — AMOXICILLIN-POT CLAVULANATE 875-125 MG PO TABS: 1.0000 | ORAL_TABLET | Freq: Two times a day (BID) | ORAL | 0 refills | Status: AC

## 2022-05-18 NOTE — Telephone Encounter (Addendum)
Received call from [facility] in regards to pt's Cheratussin AC. [facility] has been unable to have it in stock & only thing they have is Promethazine DM, s/w Dr.Brimage if ok to switch to alternative & she agreed. [facility] made aware & original rx discontinued.

## 2022-05-18 NOTE — ED Provider Notes (Addendum)
[facility]    CSN: [id number] Arrival date & time: 05/18/22  0805      History   Chief Complaint Chief Complaint  Patient presents with   Cough    HPI Trevor Mann is a 47 y.o. male.   HPI   Trevor Mann presents for dry cough for the past 2 weeks. No sore throat. Started wheezing in couple of days especially last night. No history of asthma or smoking. No known sick contacts. Theres fever, chills, body aches,  Has some new nasal congestion. Felt shortness of breath with coughing spells. He is eating and drinking well. No headache, nausea, vomiting or diarrhea. Took some Alka-Seltzer and NyQuil without much relief.    Past Medical History:  Diagnosis Date   COVID-19 03/2021    Patient Active Problem List   Diagnosis Date Noted   COVID-19 03/18/2021    Past Surgical History:  Procedure Laterality Date   APPENDECTOMY         Home Medications    Prior to Admission medications   Medication Sig Start Date End Date Taking? Authorizing Provider  amoxicillin-clavulanate (AUGMENTIN) 875-125 MG tablet Take 1 tablet by mouth every 12 (twelve) hours for 5 days. 05/18/22 05/23/22 Yes Nas Wafer, DO  predniSONE (DELTASONE) 50 MG tablet Take 1 tablet (50 mg total) by mouth daily for 5 days. 05/18/22 05/23/22 Yes Desyre Calma, DO  promethazine-dextromethorphan (PROMETHAZINE-DM) 6.25-15 MG/5ML syrup Take 5 mLs by mouth 4 (four) times daily as needed. 05/18/22  Yes Shericka Johnstone, DO  albuterol (VENTOLIN HFA) 108 (90 Base) MCG/ACT inhaler Inhale 2 puffs into the lungs every 4 (four) hours as needed for wheezing or shortness of breath. 05/18/22   Zaela Graley, Ronnette Juniper, DO  fluticasone (FLONASE) 50 MCG/ACT nasal spray Place 2 sprays into both nostrils daily. 02/21/21   Melynda Ripple, MD    Family History Family History  Problem Relation Age of Onset   Lung cancer Mother     Social History Social History   Tobacco Use   Smoking status: Never   Smokeless  tobacco: Never  Vaping Use   Vaping Use: Never used  Substance Use Topics   Alcohol use: Yes   Drug use: No     Allergies   Patient has no known allergies.   Review of Systems Review of Systems: negative unless otherwise stated in HPI.      Physical Exam Triage Vital Signs ED Triage Vitals  Enc Vitals Group     BP 05/18/22 0823 126/74     Pulse Rate 05/18/22 0823 73     Resp 05/18/22 0823 16     Temp 05/18/22 0823 98 F (36.7 C)     Temp Source 05/18/22 0823 Oral     SpO2 05/18/22 0823 96 %     Weight 05/18/22 0822 (!) 350 lb (158.8 kg)     Height 05/18/22 0822 6' 7"$  (2.007 m)     Head Circumference --      Peak Flow --      Pain Score 05/18/22 0822 0     Pain Loc --      Pain Edu? --      Excl. in [place]? --    No data found.  Updated Vital Signs BP 126/74 (BP Location: Left Arm)   Pulse 73   Temp 98 F (36.7 C) (Oral)   Resp 16   Ht 6' 7"$  (2.007 m)   Wt (!) 158.8 kg   SpO2 96%   BMI  39.43 kg/m   Visual Acuity Right Eye Distance:   Left Eye Distance:   Bilateral Distance:    Right Eye Near:   Left Eye Near:    Bilateral Near:     Physical Exam GEN: pleasant non-toxic appearing male, in no acute distress  CV: regular rate and rhythm, no murmurs appreciated  RESP: no increased work of breathing, diffuse coarse breathe sounds and wheezing, decreased air flow bilaterally  SKIN: warm, dry, no rash on visible skin NEURO: alert, moves all extremities appropriately PSYCH: Normal affect, appropriate speech and behavior       UC Treatments / Results  Labs (all labs ordered are listed, but only abnormal results are displayed) Labs Reviewed - No data to display  EKG   Radiology No results found.  Procedures Procedures (including critical care time)  Medications Ordered in UC Medications  ipratropium-albuterol (DUONEB) 0.5-2.5 (3) MG/3ML nebulizer solution 3 mL (3 mLs Nebulization Given 05/18/22 0908)    Initial Impression / Assessment and  Plan / UC Course  I have reviewed the triage vital signs and the nursing notes.  Pertinent labs & imaging results that were available during my care of the patient were reviewed by me and considered in my medical decision making (see chart for details).       Pt is a 47 y.o. male who presents for 2 weeks of cough that is not improving.  Alastair is afebrile here without recent antipyretics. Satting adequately on room air. Overall pt is  non-toxic appearing, well hydrated, without respiratory distress. Pulmonary exam is remarkable for coarse breathe sounds and wheezing.  After shared decision making, we will not pursue chest x-ray at this time as it currently would not change management.  Given DuoNeb here with improvement. COVID  and influenza testing deferred due to length of symptoms. Home COVID testing was negative.   Treat acute bronchitis with steroids and antibiotics as below.  Albuterol inhaler and cough syrup given for cough allow patient to rest.  Typical duration of symptoms discussed. Return and ED precautions given and patient voiced understanding.   Discussed MDM, treatment plan and plan for follow-up with patient who agrees with plan.     Final Clinical Impressions(s) / UC Diagnoses   Final diagnoses:  Acute bronchitis, unspecified organism     Discharge Instructions      Stop by the pharmacy to pick up your prescriptions.  Return if your symptoms do not improve.        ED Prescriptions     Medication Sig Dispense Auth. Provider   albuterol (VENTOLIN HFA) 108 (90 Base) MCG/ACT inhaler Inhale 2 puffs into the lungs every 4 (four) hours as needed for wheezing or shortness of breath. 1 g Danikah Budzik, DO   guaiFENesin-codeine (CHERATUSSIN AC) 100-10 MG/5ML syrup  (Status: Discontinued) Take 5 mLs by mouth 3 (three) times daily as needed. 120 mL Tarra Pence, DO   predniSONE (DELTASONE) 50 MG tablet Take 1 tablet (50 mg total) by mouth daily for 5 days. 5 tablet  Artesia Berkey, DO   amoxicillin-clavulanate (AUGMENTIN) 875-125 MG tablet Take 1 tablet by mouth every 12 (twelve) hours for 5 days. 10 tablet Dalessandro Baldyga, DO   promethazine-dextromethorphan (PROMETHAZINE-DM) 6.25-15 MG/5ML syrup Take 5 mLs by mouth 4 (four) times daily as needed. 118 mL Johnny Latu, Ronnette Juniper, DO      I have reviewed the PDMP during this encounter.     Lyndee Hensen, DO 05/18/22 [id number]

## 2022-05-18 NOTE — Discharge Instructions (Addendum)
Stop by the pharmacy to pick up your prescriptions.  Return if your symptoms do not improve.

## 2022-05-18 NOTE — ED Triage Notes (Signed)
Pt c/o cough x2weeks Sinus pressure and wheezing x1day  Pt states that it is non-productive and denies facial pain, SOB, or headaches and dizziness.  Pt has been taking alkaseltzer allergy and OTC cough medication for symptoms.

## 2022-05-24 ENCOUNTER — Ambulatory Visit
Admission: EM | Admit: 2022-05-24 | Discharge: 2022-05-24 | Disposition: A | Discharge status: Home / Self Care | Payer: 59 | Attending: Emergency Medicine | Admitting: Emergency Medicine

## 2022-05-24 ENCOUNTER — Encounter: Payer: Self-pay | Admitting: Emergency Medicine

## 2022-05-24 ENCOUNTER — Ambulatory Visit (INDEPENDENT_AMBULATORY_CARE_PROVIDER_SITE_OTHER): Payer: 59

## 2022-05-24 DIAGNOSIS — R051 Acute cough: Secondary | ICD-10-CM

## 2022-05-24 MED ORDER — LEVOFLOXACIN 500 MG PO TABS: 500.0000 mg | ORAL_TABLET | Freq: Every day | ORAL | 0 refills | Status: AC

## 2022-05-24 MED ORDER — PROMETHAZINE-DM 6.25-15 MG/5ML PO SYRP: 5.0000 mL | ORAL_SOLUTION | Freq: Four times a day (QID) | ORAL | 0 refills | Status: AC | PRN

## 2022-05-24 MED ORDER — PREDNISONE 50 MG PO TABS: 50.0000 mg | ORAL_TABLET | Freq: Every day | ORAL | 0 refills | Status: AC

## 2022-05-24 NOTE — ED Triage Notes (Signed)
Pt c/o cough. Started about a month ago. He was seen last week and treated with a steroid. He states the cough gets worse at night. He states he is wheezing sometimes.

## 2022-05-24 NOTE — ED Provider Notes (Signed)
[facility]    CSN: [id number] Arrival date & time: 05/24/22  1354      History   Chief Complaint Chief Complaint  Patient presents with   Cough    HPI Trevor Mann is a 47 y.o. male.   Patient presents for evaluation of a nonproductive cough and intermittent wheezing present for 3 weeks.  Was evaluated in urgent care 6 days ago, diagnosed with bronchitis, treated with Augmentin, prednisone, Promethazine DM and albuterol inhaler, patient is taking all medication as directed and seen no improvement.  Endorsing that wheezing has improved but has not resolved and is worse when lying down.  Denies shortness of breath, chest  pain or tightness, fever, chills or URI symptoms.   Denies Respiratory history.  Non-smoker.  Past Medical History:  Diagnosis Date   COVID-19 03/2021    Patient Active Problem List   Diagnosis Date Noted   COVID-19 03/18/2021    Past Surgical History:  Procedure Laterality Date   APPENDECTOMY         Home Medications    Prior to Admission medications   Medication Sig Start Date End Date Taking? Authorizing Provider  albuterol (VENTOLIN HFA) 108 (90 Base) MCG/ACT inhaler Inhale 2 puffs into the lungs every 4 (four) hours as needed for wheezing or shortness of breath. 05/18/22   Brimage, Ronnette Juniper, DO  fluticasone (FLONASE) 50 MCG/ACT nasal spray Place 2 sprays into both nostrils daily. 02/21/21   Melynda Ripple, MD  promethazine-dextromethorphan (PROMETHAZINE-DM) 6.25-15 MG/5ML syrup Take 5 mLs by mouth 4 (four) times daily as needed. 05/18/22   Lyndee Hensen, DO    Family History Family History  Problem Relation Age of Onset   Lung cancer Mother     Social History Social History   Tobacco Use   Smoking status: Never   Smokeless tobacco: Never  Vaping Use   Vaping Use: Never used  Substance Use Topics   Alcohol use: Yes   Drug use: No     Allergies   Patient has no known allergies.   Review of Systems Review of  Systems  Constitutional: Negative.   HENT: Negative.    Respiratory:  Positive for cough and wheezing. Negative for apnea, choking, chest tightness, shortness of breath and stridor.   Cardiovascular: Negative.   Gastrointestinal: Negative.   Skin: Negative.      Physical Exam Triage Vital Signs ED Triage Vitals  Enc Vitals Group     BP 05/24/22 1419 (!) 155/102     Pulse Rate 05/24/22 1419 67     Resp 05/24/22 1419 16     Temp 05/24/22 1419 98.3 F (36.8 C)     Temp Source 05/24/22 1419 Oral     SpO2 05/24/22 1419 95 %     Weight 05/24/22 1418 (!) 350 lb 1.5 oz (158.8 kg)     Height 05/24/22 1418 6' 7"$  (2.007 m)     Head Circumference --      Peak Flow --      Pain Score 05/24/22 1417 0     Pain Loc --      Pain Edu? --      Excl. in [place]? --    No data found.  Updated Vital Signs BP (!) 155/102 (BP Location: Right Arm)   Pulse 67   Temp 98.3 F (36.8 C) (Oral)   Resp 16   Ht 6' 7"$  (2.007 m)   Wt (!) 350 lb 1.5 oz (158.8 kg)   SpO2  95%   BMI 39.44 kg/m   Visual Acuity Right Eye Distance:   Left Eye Distance:   Bilateral Distance:    Right Eye Near:   Left Eye Near:    Bilateral Near:     Physical Exam Constitutional:      Appearance: Normal appearance.  HENT:     Right Ear: Tympanic membrane, ear canal and external ear normal.     Left Ear: Tympanic membrane, ear canal and external ear normal.     Nose: Nose normal.     Mouth/Throat:     Mouth: Mucous membranes are moist.     Pharynx: Oropharynx is clear.  Eyes:     Extraocular Movements: Extraocular movements intact.  Cardiovascular:     Rate and Rhythm: Normal rate and regular rhythm.     Pulses: Normal pulses.     Heart sounds: Normal heart sounds.  Pulmonary:     Effort: Pulmonary effort is normal.     Breath sounds: Wheezing and rhonchi present.  Skin:    General: Skin is warm and dry.  Neurological:     Mental Status: He is alert and oriented to person, place, and time. Mental status is  at baseline.      UC Treatments / Results  Labs (all labs ordered are listed, but only abnormal results are displayed) Labs Reviewed - No data to display  EKG   Radiology No results found.  Procedures Procedures (including critical care time)  Medications Ordered in UC Medications - No data to display  Initial Impression / Assessment and Plan / UC Course  I have reviewed the triage vital signs and the nursing notes.  Pertinent labs & imaging results that were available during my care of the patient were reviewed by me and considered in my medical decision making (see chart for details).  Acute cough  Vital signs are stable, O2 saturation 95% on room air, wheezing and rhonchi heard to the lower lobes, not cleared with coughing which patient has persistently done throughout exam, chest x-ray showing atelectasis but no pneumonia, will attempt use of Levaquin as well as an additional round of prednisone and Promethazine DM for outpatient use, given strict precautions for worsening breathing to go to the nearest emergency department for further evaluation for persisting symptoms given over referral to pulmonology for further evaluation Final Clinical Impressions(s) / UC Diagnoses   Final diagnoses:  None   Discharge Instructions   None    ED Prescriptions   None    PDMP not reviewed this encounter.   Hans Eden, [place] 05/24/22 [phone]

## 2022-05-24 NOTE — Discharge Instructions (Addendum)
Chest x-ray does not show pneumonia, however does show atelectasis which is means that the lungs are expanding  Will attempt use of a different antibiotic, take Levaquin every morning increase your fluid intake attempt use of over-the-counter Imodium to slow symptom  Begin prednisone every morning with food for 5 days to reduce inflammation and relax the airway  You may continue use of cough syrup as needed, cough is drowsy  May use humidifier at night to do not have a humidifier you may steam up your bathroom and sit inside for 5 to 10 minutes  Ensure that you are not in warm dry stuffy environment to assist coughing worse  Ensure that you are drinking adequate fluid intake primarily through water to keep airway moistened which will help to lessen your cough  At any point if your breathing worsens please go to the nearest emergency department for evaluation for IV   If your coughing persists please schedule follow-up appointment with pulmonologist lung specialist, information is on front page

## 2023-06-25 IMAGING — CR DG CHEST 2V
3 series · 3 of 3 positions shown · non-contrast
Comparison: None.

CLINICAL DATA: Cough x6 days.  COVID positive.

EXAM:
CHEST - 2 VIEW

[chest pa (1 of 2)]
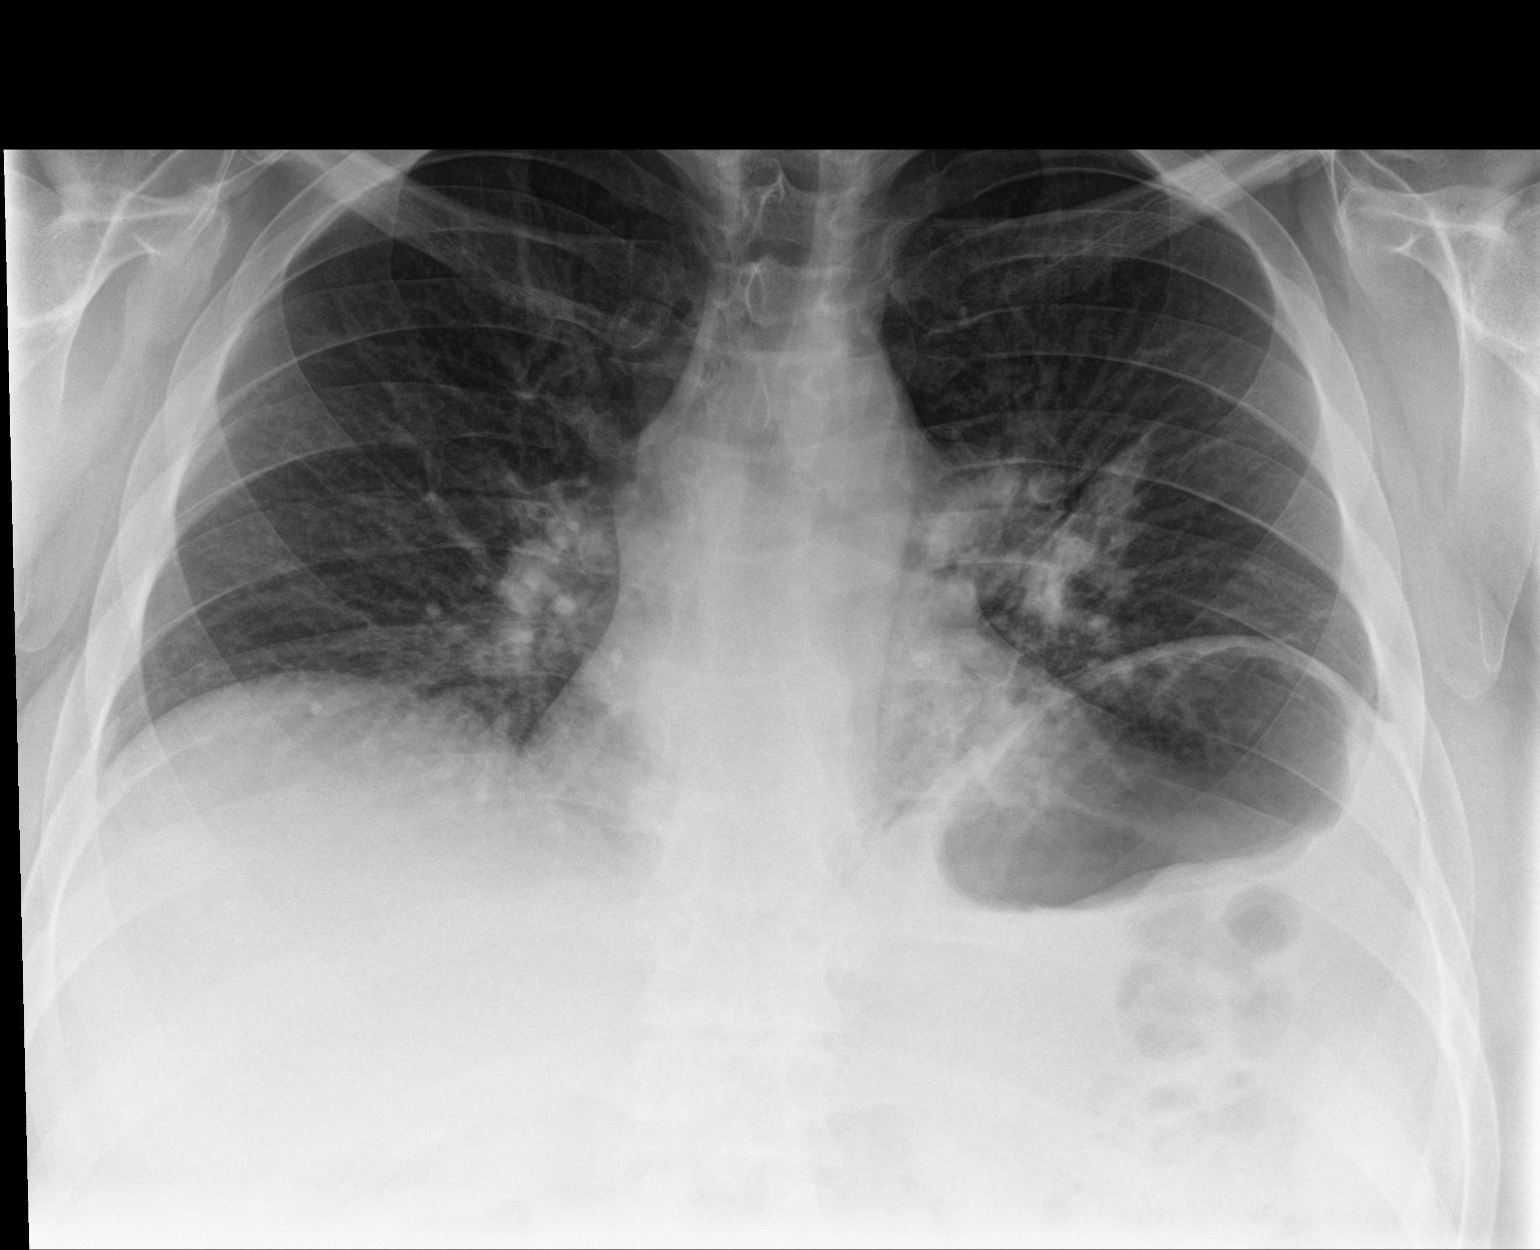

[chest lat]
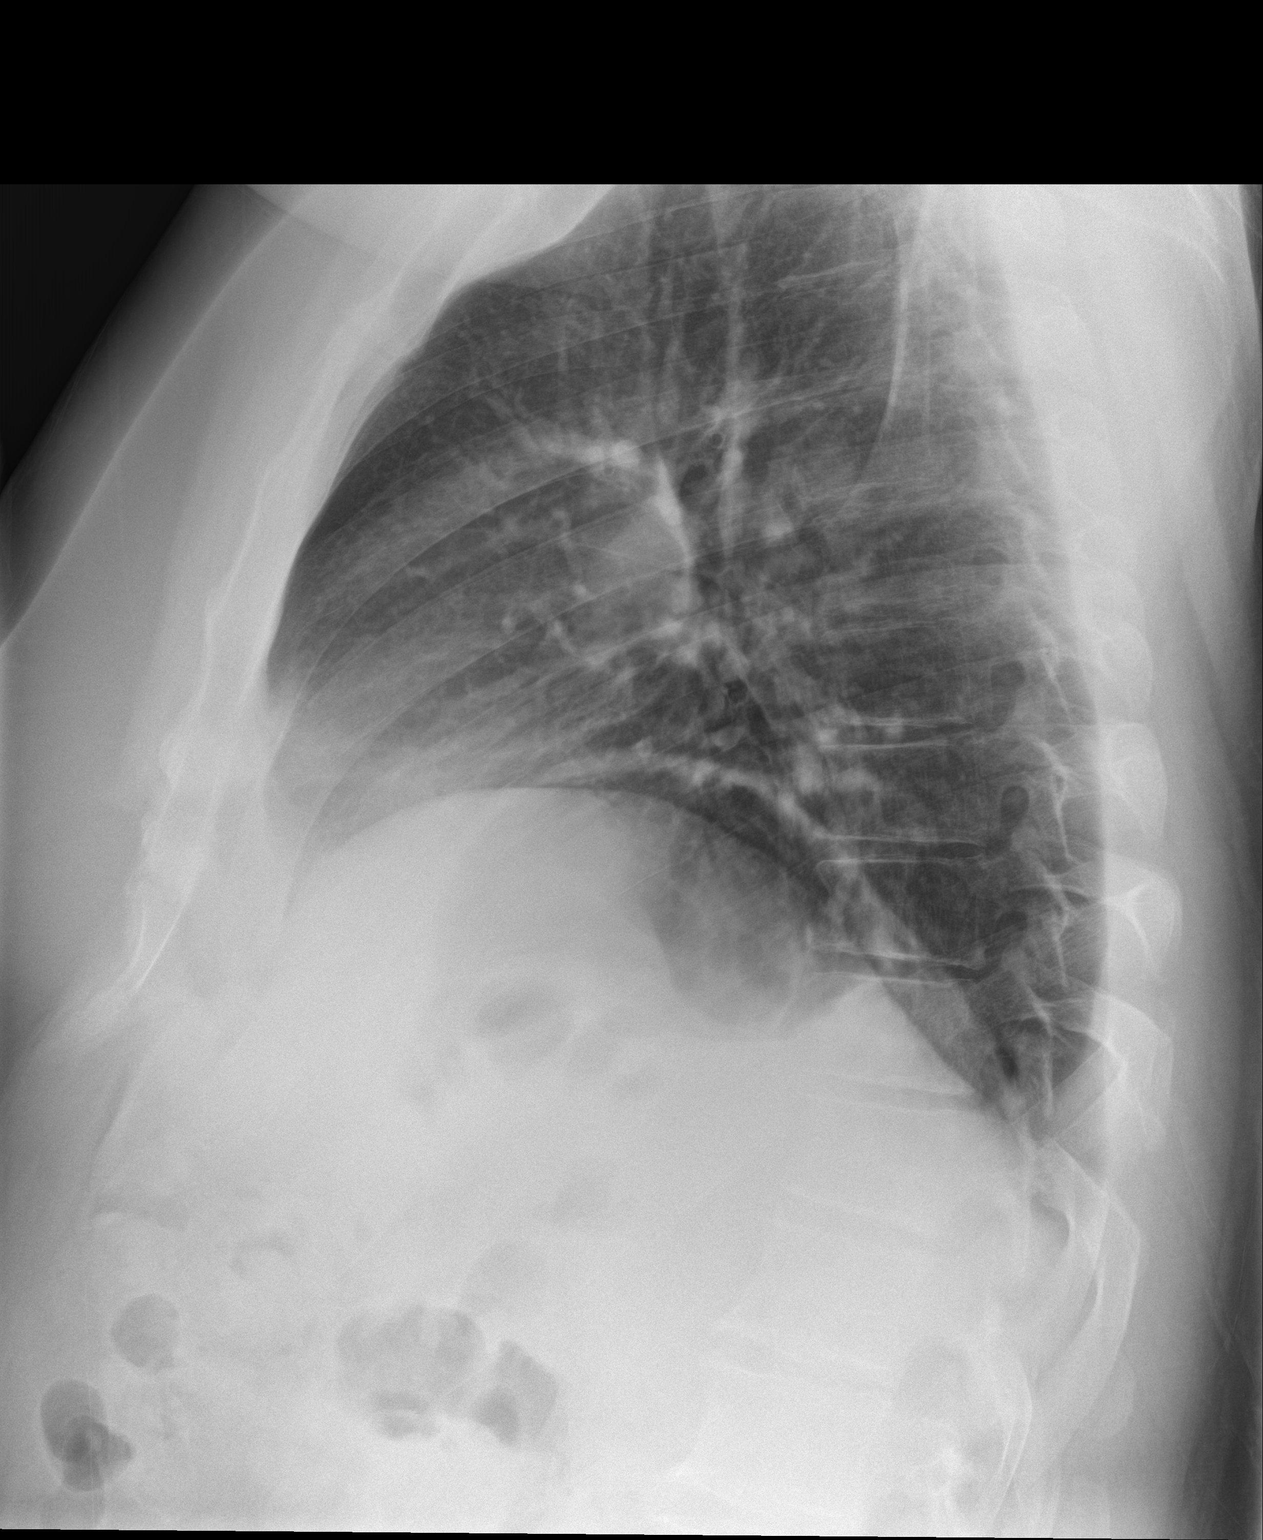

[chest pa (2 of 2)]
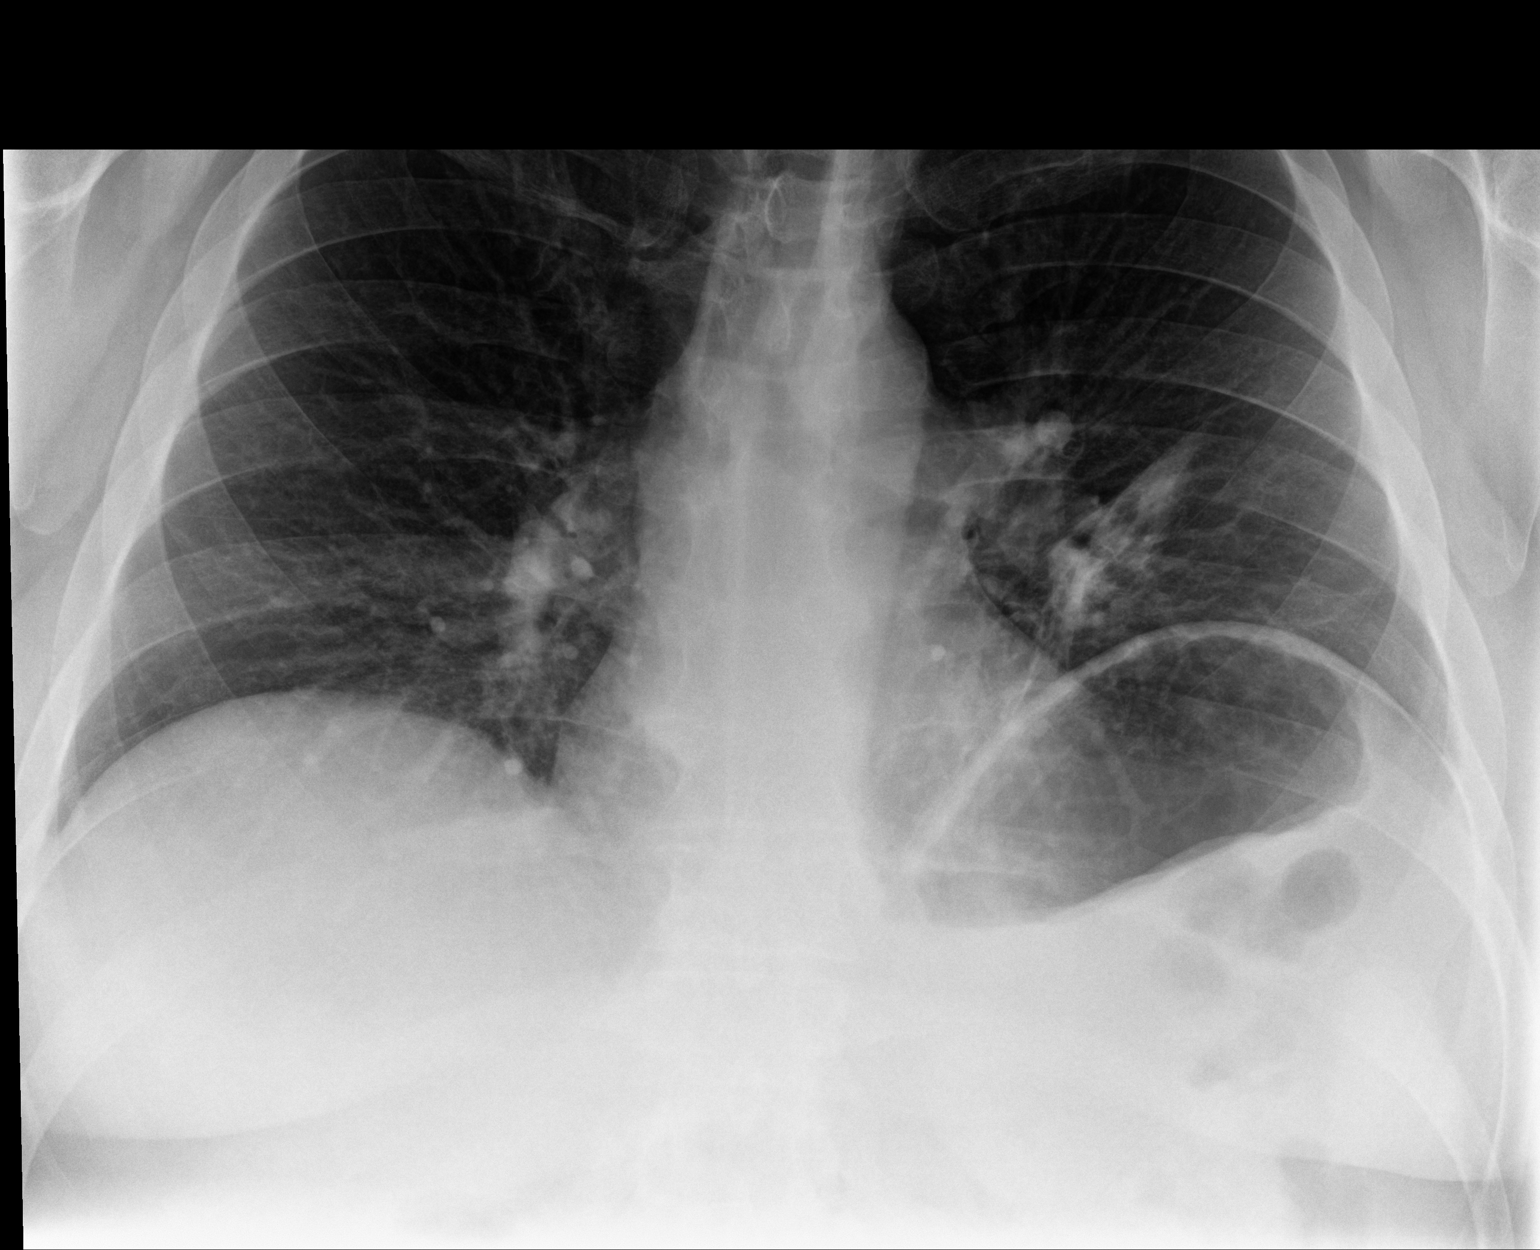

[3 of 3 positions shown; findings below may reference images not displayed]

FINDINGS: The heart size and mediastinal contours are within normal limits.
Low lung volumes with bibasilar atelectasis. No focal airspace
consolidation. No visible pleural effusion or pneumothorax. The
visualized skeletal structures are unremarkable.
IMPRESSION: Low lung volumes with bibasilar atelectasis. No focal airspace
consolidation.
